# Patient Record
Sex: Female | Born: 1950 | Race: White | Hispanic: No | Smoking: Never smoker
Health system: Southern US, Community
[De-identification: ages and names within clinical notes are randomized; demographics above are authoritative.]

## PROBLEM LIST (undated history)

## (undated) DIAGNOSIS — M1990 Unspecified osteoarthritis, unspecified site: Secondary | ICD-10-CM

## (undated) DIAGNOSIS — H409 Unspecified glaucoma: Secondary | ICD-10-CM

## (undated) DIAGNOSIS — G8929 Other chronic pain: Secondary | ICD-10-CM

## (undated) DIAGNOSIS — E669 Obesity, unspecified: Secondary | ICD-10-CM

## (undated) DIAGNOSIS — E039 Hypothyroidism, unspecified: Secondary | ICD-10-CM

## (undated) DIAGNOSIS — M549 Dorsalgia, unspecified: Secondary | ICD-10-CM

## (undated) DIAGNOSIS — I10 Essential (primary) hypertension: Secondary | ICD-10-CM

## (undated) HISTORY — PX: ABDOMINAL HYSTERECTOMY: SHX81

## (undated) HISTORY — PX: TUBAL LIGATION: SHX77

## (undated) HISTORY — PX: REPLACEMENT TOTAL KNEE: SUR1224

## (undated) HISTORY — PX: LUMBAR LAMINECTOMY: SHX95

---

## 2004-12-18 ENCOUNTER — Ambulatory Visit: Payer: Self-pay | Admitting: Internal Medicine

## 2006-02-03 ENCOUNTER — Ambulatory Visit: Payer: Self-pay | Admitting: Internal Medicine

## 2010-12-25 ENCOUNTER — Ambulatory Visit: Payer: Self-pay | Admitting: Internal Medicine

## 2012-03-17 ENCOUNTER — Ambulatory Visit: Payer: Self-pay

## 2014-05-16 ENCOUNTER — Other Ambulatory Visit: Payer: Self-pay | Admitting: Internal Medicine

## 2014-05-16 DIAGNOSIS — Z1231 Encounter for screening mammogram for malignant neoplasm of breast: Secondary | ICD-10-CM

## 2014-05-30 ENCOUNTER — Ambulatory Visit
Admission: RE | Admit: 2014-05-30 | Discharge: 2014-05-30 | Disposition: A | Discharge status: Home / Self Care | Payer: BLUE CROSS/BLUE SHIELD | Source: Ambulatory Visit | Attending: Internal Medicine | Admitting: Internal Medicine

## 2014-05-30 DIAGNOSIS — Z1231 Encounter for screening mammogram for malignant neoplasm of breast: Secondary | ICD-10-CM | POA: Insufficient documentation

## 2014-09-06 ENCOUNTER — Emergency Department: Payer: No Typology Code available for payment source

## 2014-09-06 ENCOUNTER — Emergency Department
Admission: EM | Admit: 2014-09-06 | Discharge: 2014-09-06 | Disposition: A | Discharge status: Home / Self Care | Payer: No Typology Code available for payment source | Attending: Emergency Medicine | Admitting: Emergency Medicine

## 2014-09-06 ENCOUNTER — Encounter: Payer: Self-pay | Admitting: *Deleted

## 2014-09-06 DIAGNOSIS — S20211A Contusion of right front wall of thorax, initial encounter: Secondary | ICD-10-CM

## 2014-09-06 DIAGNOSIS — Y92410 Unspecified street and highway as the place of occurrence of the external cause: Secondary | ICD-10-CM | POA: Diagnosis not present

## 2014-09-06 DIAGNOSIS — Y998 Other external cause status: Secondary | ICD-10-CM | POA: Insufficient documentation

## 2014-09-06 DIAGNOSIS — Y9389 Activity, other specified: Secondary | ICD-10-CM | POA: Insufficient documentation

## 2014-09-06 DIAGNOSIS — I10 Essential (primary) hypertension: Secondary | ICD-10-CM | POA: Diagnosis not present

## 2014-09-06 DIAGNOSIS — S1081XA Abrasion of other specified part of neck, initial encounter: Secondary | ICD-10-CM | POA: Insufficient documentation

## 2014-09-06 DIAGNOSIS — S299XXA Unspecified injury of thorax, initial encounter: Secondary | ICD-10-CM | POA: Diagnosis present

## 2014-09-06 HISTORY — DX: Essential (primary) hypertension: I10

## 2014-09-06 LAB — GLUCOSE, CAPILLARY: GLUCOSE-CAPILLARY: 69 mg/dL (ref 65–99)

## 2014-09-06 MED ORDER — IBUPROFEN 800 MG PO TABS
800.0000 mg | ORAL_TABLET | Freq: Once | ORAL | Status: AC
  Administered 2014-09-06: 800 mg via ORAL
  Filled 2014-09-06: qty 1

## 2014-09-06 MED ORDER — NAPROXEN 500 MG PO TABS: 500.0000 mg | ORAL_TABLET | Freq: Two times a day (BID) | ORAL | Status: AC
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
## 2017-04-30 ENCOUNTER — Encounter: Payer: Self-pay | Admitting: *Deleted

## 2017-05-01 ENCOUNTER — Ambulatory Visit: Payer: Medicare Other | Admitting: Certified Registered"

## 2017-05-01 ENCOUNTER — Other Ambulatory Visit: Payer: Self-pay

## 2017-05-01 ENCOUNTER — Encounter
Admission: RE | Disposition: A | Discharge status: Home / Self Care | Payer: Self-pay | Source: Ambulatory Visit | Attending: Gastroenterology

## 2017-05-01 ENCOUNTER — Ambulatory Visit
Admission: RE | Admit: 2017-05-01 | Discharge: 2017-05-01 | Disposition: A | Discharge status: Home / Self Care | Payer: Medicare Other | Source: Ambulatory Visit | Attending: Gastroenterology | Admitting: Gastroenterology

## 2017-05-01 DIAGNOSIS — I10 Essential (primary) hypertension: Secondary | ICD-10-CM | POA: Insufficient documentation

## 2017-05-01 DIAGNOSIS — D122 Benign neoplasm of ascending colon: Secondary | ICD-10-CM | POA: Insufficient documentation

## 2017-05-01 DIAGNOSIS — K621 Rectal polyp: Secondary | ICD-10-CM | POA: Diagnosis not present

## 2017-05-01 DIAGNOSIS — Z1211 Encounter for screening for malignant neoplasm of colon: Secondary | ICD-10-CM | POA: Insufficient documentation

## 2017-05-01 DIAGNOSIS — E669 Obesity, unspecified: Secondary | ICD-10-CM | POA: Insufficient documentation

## 2017-05-01 DIAGNOSIS — Z79890 Hormone replacement therapy (postmenopausal): Secondary | ICD-10-CM | POA: Insufficient documentation

## 2017-05-01 DIAGNOSIS — E039 Hypothyroidism, unspecified: Secondary | ICD-10-CM | POA: Diagnosis not present

## 2017-05-01 DIAGNOSIS — K644 Residual hemorrhoidal skin tags: Secondary | ICD-10-CM | POA: Diagnosis not present

## 2017-05-01 DIAGNOSIS — Z79899 Other long term (current) drug therapy: Secondary | ICD-10-CM | POA: Diagnosis not present

## 2017-05-01 DIAGNOSIS — K5730 Diverticulosis of large intestine without perforation or abscess without bleeding: Secondary | ICD-10-CM | POA: Insufficient documentation

## 2017-05-01 DIAGNOSIS — Z791 Long term (current) use of non-steroidal anti-inflammatories (NSAID): Secondary | ICD-10-CM | POA: Insufficient documentation

## 2017-05-01 DIAGNOSIS — Z6841 Body Mass Index (BMI) 40.0 and over, adult: Secondary | ICD-10-CM | POA: Insufficient documentation

## 2017-05-01 HISTORY — PX: COLONOSCOPY WITH PROPOFOL: SHX5780

## 2017-05-01 HISTORY — DX: Hypomagnesemia: E83.42

## 2017-05-01 HISTORY — DX: Unspecified osteoarthritis, unspecified site: M19.90

## 2017-05-01 HISTORY — DX: Unspecified glaucoma: H40.9

## 2017-05-01 HISTORY — DX: Obesity, unspecified: E66.9

## 2017-05-01 HISTORY — DX: Dorsalgia, unspecified: M54.9

## 2017-05-01 HISTORY — DX: Other chronic pain: G89.29

## 2017-05-01 HISTORY — DX: Hypothyroidism, unspecified: E03.9

## 2017-05-01 SURGERY — COLONOSCOPY WITH PROPOFOL
Anesthesia: General

## 2017-05-01 MED ORDER — PROPOFOL 500 MG/50ML IV EMUL
INTRAVENOUS | Status: AC
  Filled 2017-05-01: qty 50

## 2017-05-01 MED ORDER — PROPOFOL 10 MG/ML IV BOLUS
INTRAVENOUS | Status: DC | PRN
  Administered 2017-05-01 (×2): 50 mg via INTRAVENOUS
  Administered 2017-05-01: 100 mg via INTRAVENOUS

## 2017-05-01 MED ORDER — LIDOCAINE HCL (PF) 2 % IJ SOLN
INTRAMUSCULAR | Status: AC
  Filled 2017-05-01: qty 10

## 2017-05-01 MED ORDER — SODIUM CHLORIDE 0.9 % IV SOLN
INTRAVENOUS | Status: DC
  Administered 2017-05-01: 10:00:00 via INTRAVENOUS

## 2017-05-01 MED ORDER — PROPOFOL 500 MG/50ML IV EMUL
INTRAVENOUS | Status: DC | PRN
  Administered 2017-05-01: 100 ug/kg/min via INTRAVENOUS

## 2017-05-01 MED ORDER — PROPOFOL 10 MG/ML IV BOLUS
INTRAVENOUS | Status: AC
  Filled 2017-05-01: qty 20

## 2017-05-01 MED ORDER — LIDOCAINE HCL (CARDIAC) PF 100 MG/5ML IV SOSY
PREFILLED_SYRINGE | INTRAVENOUS | Status: DC | PRN
  Administered 2017-05-01: 40 mg via INTRATRACHEAL

## 2017-05-01 NOTE — H&P (Signed)
Outpatient short stay form Pre-procedure 05/01/2017 10:07 AM Lollie Sails MD  Primary Physician: Dr. Fulton Reek  Reason for visit: Colonoscopy  History of present illness: Patient is a 67 year old female presenting today as above.  Takes no aspirin or blood thinning agent.  This is her first colonoscopy.  She tolerated her prep well.   No current facility-administered medications for this encounter.   Medications Prior to Admission  Medication Sig Dispense Refill Last Dose  . Cholecalciferol 5000 units TABS Take 1 tablet by mouth daily.     . cloNIDine (CATAPRES) 0.2 MG tablet Take 0.2 mg by mouth 2 (two) times daily.     Marland Kitchen estradiol (ESTRACE) 0.5 MG tablet Take 0.5 mg by mouth daily.     Marland Kitchen ibuprofen (ADVIL,MOTRIN) 200 MG tablet Take 200 mg by mouth every 6 (six) hours as needed.     Marland Kitchen levothyroxine (SYNTHROID, LEVOTHROID) 125 MCG tablet Take 125 mcg by mouth daily before breakfast.     . magnesium oxide (MAG-OX) 400 MG tablet Take 400 mg by mouth daily.     . meloxicam (MOBIC) 15 MG tablet Take 15 mg by mouth daily.     Marland Kitchen torsemide (DEMADEX) 20 MG tablet Take 20 mg by mouth daily.     . naproxen (NAPROSYN) 500 MG tablet Take 1 tablet (500 mg total) by mouth 2 (two) times daily with a meal. 20 tablet 0      Allergies  Allergen Reactions  . Losartan Potassium-Hctz      Past Medical History:  Diagnosis Date  . Arthritis    osteoarthritis  . Chronic back pain   . Glaucoma   . Hypertension   . Hypomagnesemia   . Hypothyroidism   . Obesity     Review of systems:      Physical Exam    Heart and lungs: Regular rate and rhythm without rub or gallop, lungs are bilaterally clear.    HEENT: Normocephalic atraumatic eyes are anicteric    Other:    Pertinant exam for procedure: Soft nontender nondistended bowel sounds positive normoactive    Planned proceedures: Colonoscopy and indicated procedures. I have discussed the risks benefits and complications of  procedures to include not limited to bleeding, infection, perforation and the risk of sedation and the patient wishes to proceed.    Lollie Sails, MD Gastroenterology 05/01/2017  10:07 AM

## 2017-05-01 NOTE — Transfer of Care (Signed)
Immediate Anesthesia Transfer of Care Note  Patient: Jade Martin  Procedure(s) Performed: COLONOSCOPY WITH PROPOFOL (N/A )  Patient Location: PACU and Endoscopy Unit  Anesthesia Type:General  Level of Consciousness: awake, alert , oriented and patient cooperative  Airway & Oxygen Therapy: Patient Spontanous Breathing  Post-op Assessment: Report given to RN, Post -op Vital signs reviewed and stable and Patient moving all extremities  Post vital signs: Reviewed and stable  Last Vitals:  Vitals Value Taken Time  BP 114/50 05/01/2017 11:12 AM  Temp 36.3 C 05/01/2017 11:12 AM  Pulse 79 05/01/2017 11:13 AM  Resp 16 05/01/2017 11:13 AM  SpO2 97 % 05/01/2017 11:13 AM  Vitals shown include unvalidated device data.  Last Pain:  Vitals:   05/01/17 1112  TempSrc: Tympanic  PainSc: 0-No pain         Complications: No apparent anesthesia complications

## 2017-05-01 NOTE — Anesthesia Preprocedure Evaluation (Signed)
Anesthesia Evaluation  Patient identified by MRN, date of birth, ID band Patient awake    Reviewed: Allergy & Precautions, H&P , NPO status , Patient's Chart, lab work & pertinent test results, reviewed documented beta blocker date and time   Airway Mallampati: II   Neck ROM: full    Dental  (+) Poor Dentition, Teeth Intact   Pulmonary neg pulmonary ROS,    Pulmonary exam normal        Cardiovascular Exercise Tolerance: Good hypertension, On Medications negative cardio ROS Normal cardiovascular exam Rhythm:regular Rate:Normal     Neuro/Psych negative neurological ROS  negative psych ROS   GI/Hepatic negative GI ROS, Neg liver ROS,   Endo/Other  negative endocrine ROSHypothyroidism   Renal/GU negative Renal ROS  negative genitourinary   Musculoskeletal   Abdominal   Peds  Hematology negative hematology ROS (+)   Anesthesia Other Findings Past Medical History: No date: Arthritis     Comment:  osteoarthritis No date: Chronic back pain No date: Glaucoma No date: Hypertension No date: Hypomagnesemia No date: Hypothyroidism No date: Obesity Past Surgical History: No date: ABDOMINAL HYSTERECTOMY No date: LUMBAR LAMINECTOMY No date: TUBAL LIGATION   Reproductive/Obstetrics negative OB ROS                             Anesthesia Physical Anesthesia Plan  ASA: III  Anesthesia Plan: General   Post-op Pain Management:    Induction:   PONV Risk Score and Plan:   Airway Management Planned:   Additional Equipment:   Intra-op Plan:   Post-operative Plan:   Informed Consent: I have reviewed the patients History and Physical, chart, labs and discussed the procedure including the risks, benefits and alternatives for the proposed anesthesia with the patient or authorized representative who has indicated his/her understanding and acceptance.   Dental Advisory Given  Plan Discussed  with: CRNA  Anesthesia Plan Comments:         Anesthesia Quick Evaluation

## 2017-05-01 NOTE — Op Note (Signed)
[facility] Patient Name: Jade Martin Procedure Date: 05/01/2017 10:22 AM MRN: [id number] Account #: [id number] Date of Birth: 1950/09/22 Admit Type: Outpatient Age: 67 Room: [facility] ENDO ROOM 1 Gender: Female Note Status: Finalized Procedure:            Colonoscopy Indications:          Screening for colorectal malignant neoplasm, This is                        the patient's first colonoscopy Providers:            Lollie Sails, MD Referring MD:         Leonie Douglas. Doy Hutching, MD (Referring MD) Medicines:            Monitored Anesthesia Care Complications:        No immediate complications. Procedure:            Pre-Anesthesia Assessment:                       - ASA Grade Assessment: III - A patient with severe                        systemic disease.                       After obtaining informed consent, the colonoscope was                        passed under direct vision. Throughout the procedure,                        the patient's blood pressure, pulse, and oxygen                        saturations were monitored continuously. The                        Colonoscope was introduced through the anus and                        advanced to the the cecum, identified by appendiceal                        orifice and ileocecal valve. The colonoscopy was                        performed without difficulty. The patient tolerated the                        procedure well. The quality of the bowel preparation                        was good. The Colonoscope was introduced through the                        anus and advanced to the the cecum, identified by                        appendiceal orifice and ileocecal valve. The quality of  the bowel preparation was good. Findings:      A few small-mouthed diverticula were found in the sigmoid colon.      Three sessile polyps were found in the rectum. The polyps were 2 to 3 mm   in size. These polyps were removed with a cold snare and cold forcep.       Resection and retrieval were complete.      A 3 mm polyp was found in the ascending colon. The polyp was sessile.       The polyp was removed with a cold biopsy forceps. Resection and       retrieval were complete.      The exam was otherwise normal throughout the examined colon.      The retroflexed view of the distal rectum and anal verge was normal and       showed no anal or rectal abnormalities.      Skin tag, about 3-4 cm on a single stalk, found on perianal exam, this       about 5 cm from the anal orifice on the right gluteul area, without       apparent inflammation.      -The scope was changed due to scope coming out with flatus passing. Impression:           - Diverticulosis in the sigmoid colon.                       - Three 2 to 3 mm polyps in the rectum, removed with a                        cold snare. Resected and retrieved.                       - One 3 mm polyp in the ascending colon, removed with a                        cold biopsy forceps. Resected and retrieved.                       - The distal rectum and anal verge are normal on                        retroflexion view.                       - Perianal skin tags found on perianal exam. Recommendation:       - Discharge patient to home.                       - Await pathology results.                       - Telephone GI clinic for pathology results in 1 week.                       - consider referral to dermatology RE: skin tag as noted Procedure Code(s):    --- Professional ---                       [id number], Colonoscopy, flexible; with removal of tumor(s),  polyp(s), or other lesion(s) by snare technique                       45380, 59, Colonoscopy, flexible; with biopsy, single                        or multiple Diagnosis Code(s):    --- Professional ---                       Z12.11, Encounter for screening for  malignant neoplasm                        of colon                       K62.1, Rectal polyp                       D12.2, Benign neoplasm of ascending colon                       K64.4, Residual hemorrhoidal skin tags                       K57.30, Diverticulosis of large intestine without                        perforation or abscess without bleeding CPT copyright 2017 American Medical Association. All rights reserved. The codes documented in this report are preliminary and upon coder review may  be revised to meet current compliance requirements. Lollie Sails, MD 05/01/2017 11:17:45 AM This report has been signed electronically. Number of Addenda: 0 Note Initiated On: 05/01/2017 10:22 AM Scope Withdrawal Time: 0 hours 9 minutes 18 seconds  Total Procedure Duration: 0 hours 25 minutes 33 seconds       [facility]

## 2017-05-01 NOTE — Anesthesia Postprocedure Evaluation (Signed)
Anesthesia Post Note  Patient: Jade Martin  Procedure(s) Performed: COLONOSCOPY WITH PROPOFOL (N/A )  Patient location during evaluation: Endoscopy Anesthesia Type: General Level of consciousness: awake and alert, oriented and patient cooperative Pain management: satisfactory to patient Vital Signs Assessment: post-procedure vital signs reviewed and stable Respiratory status: spontaneous breathing and respiratory function stable Cardiovascular status: blood pressure returned to baseline and stable Postop Assessment: no headache, no backache, patient able to bend at knees, no apparent nausea or vomiting and adequate PO intake Anesthetic complications: no     Last Vitals:  Vitals:   05/01/17 1015 05/01/17 1112  BP: (!) 187/96 (!) 114/50  Pulse: 83 76  Resp:  14  Temp: 36.7 C (!) 36.3 C  SpO2: 99% 97%    Last Pain:  Vitals:   05/01/17 1112  TempSrc: Tympanic  PainSc: 0-No pain                 Lorenza Winkleman H Avery Eustice

## 2017-05-01 NOTE — Anesthesia Post-op Follow-up Note (Signed)
Anesthesia QCDR form completed.        

## 2017-05-04 ENCOUNTER — Encounter: Payer: Self-pay | Admitting: Gastroenterology

## 2017-05-04 LAB — SURGICAL PATHOLOGY

## 2017-09-14 ENCOUNTER — Other Ambulatory Visit: Payer: Self-pay | Admitting: Internal Medicine

## 2017-09-14 DIAGNOSIS — Z1231 Encounter for screening mammogram for malignant neoplasm of breast: Secondary | ICD-10-CM

## 2017-09-23 ENCOUNTER — Ambulatory Visit
Admission: RE | Admit: 2017-09-23 | Discharge: 2017-09-23 | Disposition: A | Discharge status: Home / Self Care | Payer: Medicare Other | Source: Ambulatory Visit | Attending: Internal Medicine | Admitting: Internal Medicine

## 2017-09-23 DIAGNOSIS — Z1231 Encounter for screening mammogram for malignant neoplasm of breast: Secondary | ICD-10-CM | POA: Insufficient documentation

## 2019-04-18 ENCOUNTER — Other Ambulatory Visit: Payer: Self-pay | Admitting: Internal Medicine

## 2019-04-18 DIAGNOSIS — Z1231 Encounter for screening mammogram for malignant neoplasm of breast: Secondary | ICD-10-CM

## 2019-05-02 ENCOUNTER — Ambulatory Visit
Admission: RE | Admit: 2019-05-02 | Discharge: 2019-05-02 | Disposition: A | Discharge status: Home / Self Care | Payer: Medicare Other | Source: Ambulatory Visit | Attending: Internal Medicine | Admitting: Internal Medicine

## 2019-05-02 DIAGNOSIS — Z1231 Encounter for screening mammogram for malignant neoplasm of breast: Secondary | ICD-10-CM

## 2019-05-04 ENCOUNTER — Other Ambulatory Visit: Payer: Self-pay | Admitting: Internal Medicine

## 2019-05-04 DIAGNOSIS — N6489 Other specified disorders of breast: Secondary | ICD-10-CM

## 2019-05-04 DIAGNOSIS — R928 Other abnormal and inconclusive findings on diagnostic imaging of breast: Secondary | ICD-10-CM

## 2019-05-18 ENCOUNTER — Ambulatory Visit
Admission: RE | Admit: 2019-05-18 | Discharge: 2019-05-18 | Disposition: A | Discharge status: Home / Self Care | Payer: Medicare Other | Source: Ambulatory Visit | Attending: Internal Medicine | Admitting: Internal Medicine

## 2019-05-18 DIAGNOSIS — R928 Other abnormal and inconclusive findings on diagnostic imaging of breast: Secondary | ICD-10-CM

## 2019-05-18 DIAGNOSIS — N6489 Other specified disorders of breast: Secondary | ICD-10-CM | POA: Insufficient documentation

## 2019-05-19 ENCOUNTER — Other Ambulatory Visit: Payer: Self-pay | Admitting: Internal Medicine

## 2019-05-19 DIAGNOSIS — N6320 Unspecified lump in the left breast, unspecified quadrant: Secondary | ICD-10-CM

## 2019-05-19 DIAGNOSIS — R921 Mammographic calcification found on diagnostic imaging of breast: Secondary | ICD-10-CM

## 2019-05-19 DIAGNOSIS — R928 Other abnormal and inconclusive findings on diagnostic imaging of breast: Secondary | ICD-10-CM

## 2020-01-05 ENCOUNTER — Other Ambulatory Visit: Payer: Self-pay

## 2020-01-05 ENCOUNTER — Ambulatory Visit
Admission: RE | Admit: 2020-01-05 | Discharge: 2020-01-05 | Disposition: A | Discharge status: Home / Self Care | Payer: Medicare Other | Source: Ambulatory Visit | Attending: Internal Medicine | Admitting: Internal Medicine

## 2020-01-05 DIAGNOSIS — R921 Mammographic calcification found on diagnostic imaging of breast: Secondary | ICD-10-CM | POA: Diagnosis present

## 2020-01-05 DIAGNOSIS — R928 Other abnormal and inconclusive findings on diagnostic imaging of breast: Secondary | ICD-10-CM | POA: Insufficient documentation

## 2020-01-05 DIAGNOSIS — N6320 Unspecified lump in the left breast, unspecified quadrant: Secondary | ICD-10-CM

## 2020-01-15 ENCOUNTER — Other Ambulatory Visit: Payer: Self-pay

## 2020-01-15 ENCOUNTER — Encounter: Payer: Self-pay | Admitting: Emergency Medicine

## 2020-01-15 ENCOUNTER — Ambulatory Visit
Admission: EM | Admit: 2020-01-15 | Discharge: 2020-01-15 | Disposition: A | Discharge status: Home / Self Care | Payer: Medicare Other | Attending: Physician Assistant | Admitting: Physician Assistant

## 2020-01-15 DIAGNOSIS — M5442 Lumbago with sciatica, left side: Secondary | ICD-10-CM

## 2020-01-15 MED ORDER — MELOXICAM 15 MG PO TABS: 15.0000 mg | ORAL_TABLET | Freq: Every day | ORAL | 0 refills | Status: AC

## 2020-01-15 MED ORDER — HYDROCODONE-ACETAMINOPHEN 5-325 MG PO TABS: 1.0000 | ORAL_TABLET | Freq: Four times a day (QID) | ORAL | 0 refills | Status: AC | PRN

## 2020-01-15 NOTE — Discharge Instructions (Addendum)
BACK PAIN: Stressed avoiding painful activities . RICE (REST, ICE, COMPRESSION, ELEVATION) guidelines reviewed. May alternate ice and heat. Consider use of muscle rubs, Salonpas patches, etc. Use medications as directed including muscle relaxers if prescribed. Take anti-inflammatory medications as prescribed or OTC NSAIDs/Tylenol.  F/u with PCP in 7-10 days for reexamination, and please feel free to call or return to the urgent care at any time for any questions or concerns you may have and we will be happy to help you!  ° °BACK PAIN RED FLAGS: If the back pain acutely worsens or there are any red flag symptoms such as numbness/tingling, leg weakness, saddle anesthesia, or loss of bowel/bladder control, go immediately to the ER. Follow up with us as scheduled or sooner if the pain does not begin to resolve or if it worsens before the follow up   ° °You may have a condition requiring you to follow up with Orthopedics so please call one of the following office for appointment:  ° °[facility] °[address], [place], [place] [address] °Phone: [phone] ° °[facility] °[address], [place], [place] [address] °Phone: [phone]  °

## 2020-01-15 NOTE — ED Triage Notes (Signed)
Patient c/o left hip and lower back pain that goes down her left leg that started 2 weeks.  Patient denies injury or fall.

## 2020-01-15 NOTE — ED Provider Notes (Signed)
[facility]    CSN: [id number] Arrival date & time: 01/15/20  1015      History   Chief Complaint Chief Complaint  Patient presents with   Hip Pain   Back Pain    HPI Jade Martin is a 70 y.o. female presenting for approximately 2-week history of left lower back pain with radiation to the lateral left thigh/hip.  Patient states that she also has a little bit of numbness to the area of the thigh.  Denies any weakness or falls.  Denies any specific injury.  Patient states that she believes she has sciatica because she has had it in the past.  Patient states she has had lumbar laminectomy in 1992 and has not really had any problems with her back since.  She has been taking high doses of ibuprofen over-the-counter as well as baclofen without much improvement in symptoms.  Patient admits that she has to take care of her disabled daughter which is very stressful.  She says she has not really been able to sleep very well at night because the pain.  Patient denies any loss of bowel or bladder control or saddle anesthesia.  No other complaints or concerns.  HPI  Past Medical History:  Diagnosis Date   Arthritis    osteoarthritis   Chronic back pain    Glaucoma    Hypertension    Hypomagnesemia    Hypothyroidism    Obesity     There are no problems to display for this patient.   Past Surgical History:  Procedure Laterality Date   ABDOMINAL HYSTERECTOMY     COLONOSCOPY WITH PROPOFOL N/A 05/01/2017   Procedure: COLONOSCOPY WITH PROPOFOL;  Surgeon: Lollie Sails, MD;  Location: [facility] ENDOSCOPY;  Service: Endoscopy;  Laterality: N/A;   LUMBAR LAMINECTOMY     REPLACEMENT TOTAL KNEE Bilateral    TUBAL LIGATION      OB History   No obstetric history on file.      Home Medications    Prior to Admission medications   Medication Sig Start Date End Date Taking? Authorizing Provider  Cholecalciferol 5000 units TABS Take 1 tablet by mouth daily.   Yes  [provider]  cloNIDine (CATAPRES) 0.2 MG tablet Take 0.2 mg by mouth 2 (two) times daily.   Yes [provider]  estradiol (ESTRACE) 0.5 MG tablet Take 0.5 mg by mouth daily.   Yes [provider]  HYDROcodone-acetaminophen (NORCO/VICODIN) 5-325 MG tablet Take 1 tablet by mouth every 6 (six) hours as needed for up to 3 days. 01/15/20 01/18/20 Yes Danton Clap, PA-C  ibuprofen (ADVIL,MOTRIN) 200 MG tablet Take 200 mg by mouth every 6 (six) hours as needed.   Yes [provider]  levothyroxine (SYNTHROID, LEVOTHROID) 125 MCG tablet Take 125 mcg by mouth daily before breakfast.   Yes [provider]  magnesium oxide (MAG-OX) 400 MG tablet Take 400 mg by mouth daily.   Yes [provider]  naproxen (NAPROSYN) 500 MG tablet Take 1 tablet (500 mg total) by mouth 2 (two) times daily with a meal. 09/06/14  Yes Carrie Mew, MD  torsemide (DEMADEX) 20 MG tablet Take 20 mg by mouth daily.   Yes [provider]  meloxicam (MOBIC) 15 MG tablet Take 1 tablet (15 mg total) by mouth daily. 01/15/20 02/14/20  Danton Clap, PA-C    Family History Family History  Problem Relation Age of Onset   Breast cancer Neg Hx  Social History Social History   Tobacco Use   Smoking status: Never Smoker   Smokeless tobacco: Never Used  Scientific laboratory technician Use: Never used  Substance Use Topics   Alcohol use: Yes    Comment: 1 drink every 2-3 month   Drug use: No     Allergies   Losartan potassium-hctz   Review of Systems Review of Systems  Constitutional: Negative for fatigue and fever.  Gastrointestinal: Negative for abdominal pain and vomiting.  Genitourinary: Negative for dysuria.  Musculoskeletal: Positive for back pain. Negative for arthralgias.  Skin: Negative for rash.  Neurological: Negative for weakness and numbness.     Physical Exam Triage Vital Signs ED Triage Vitals  Enc Vitals Group     BP 01/15/20 1032  (!) 179/81     Pulse Rate 01/15/20 1032 66     Resp 01/15/20 1032 16     Temp 01/15/20 1032 98.3 F (36.8 C)     Temp Source 01/15/20 1032 Oral     SpO2 01/15/20 1032 98 %     Weight 01/15/20 1029 235 lb (106.6 kg)     Height 01/15/20 1029 5\' 4"  (1.626 m)     Head Circumference --      Peak Flow --      Pain Score 01/15/20 1029 6     Pain Loc --      Pain Edu? --      Excl. in [place]? --    No data found.  Updated Vital Signs BP (!) 179/81 (BP Location: Left Arm) Comment: Patient has not been taking her Clonidine   Pulse 66    Temp 98.3 F (36.8 C) (Oral)    Resp 16    Ht 5\' 4"  (1.626 m)    Wt 235 lb (106.6 kg)    SpO2 98%    BMI 40.34 kg/m       Physical Exam Vitals and nursing note reviewed.  Constitutional:      General: She is not in acute distress.    Appearance: Normal appearance. She is not ill-appearing or toxic-appearing.  HENT:     Head: Normocephalic and atraumatic.     Nose: Nose normal.     Mouth/Throat:     Mouth: Mucous membranes are moist.     Pharynx: Oropharynx is clear.  Eyes:     General: No scleral icterus.       Right eye: No discharge.        Left eye: No discharge.     Conjunctiva/sclera: Conjunctivae normal.  Cardiovascular:     Rate and Rhythm: Normal rate and regular rhythm.     Heart sounds: Normal heart sounds.  Pulmonary:     Effort: Pulmonary effort is normal. No respiratory distress.     Breath sounds: Normal breath sounds.  Musculoskeletal:     Cervical back: Neck supple.  Skin:    General: Skin is dry.  Neurological:     General: No focal deficit present.     Mental Status: She is alert. Mental status is at baseline.     Motor: No weakness.     Gait: Gait normal.  Psychiatric:        Mood and Affect: Mood normal.        Behavior: Behavior normal.        Thought Content: Thought content normal.      UC Treatments / Results  Labs (all labs ordered are listed, but only abnormal results are  displayed) Labs Reviewed - No data  to display  EKG   Radiology No results found.  Procedures Procedures (including critical care time)  Medications Ordered in UC Medications - No data to display  Initial Impression / Assessment and Plan / UC Course  I have reviewed the triage vital signs and the nursing notes.  Pertinent labs & imaging results that were available during my care of the patient were reviewed by me and considered in my medical decision making (see chart for details).    70 year old female presenting for 2-week history of atraumatic back pain.  Exam is consistent with lumbar strain versus bulging disc or sciatica.  Treating with meloxicam.  Patient states she has muscle relaxers at home.  Also sent a short supply of Norco.  Controlled substance database reviewed and patient low risk for abuse.  Return and ED precautions for back pain discussed with patient.   Final Clinical Impressions(s) / UC Diagnoses   Final diagnoses:  Acute left-sided low back pain with left-sided sciatica     Discharge Instructions     BACK PAIN: Stressed avoiding painful activities . RICE (REST, ICE, COMPRESSION, ELEVATION) guidelines reviewed. May alternate ice and heat. Consider use of muscle rubs, Salonpas patches, etc. Use medications as directed including muscle relaxers if prescribed. Take anti-inflammatory medications as prescribed or OTC NSAIDs/Tylenol.  F/u with PCP in 7-10 days for reexamination, and please feel free to call or return to the urgent care at any time for any questions or concerns you may have and we will be happy to help you!   BACK PAIN RED FLAGS: If the back pain acutely worsens or there are any red flag symptoms such as numbness/tingling, leg weakness, saddle anesthesia, or loss of bowel/bladder control, go immediately to the ER. Follow up with [place] as scheduled or sooner if the pain does not begin to resolve or if it worsens before the follow up    You may have a condition requiring you to follow up  with Orthopedics so please call one of the following office for appointment:   [facility] [address] [place], [place] [address] Phone: [phone]  [facility] [address] [place], [place] [address] Phone: [phone]     ED Prescriptions    Medication Sig Dispense Auth. Provider   meloxicam (MOBIC) 15 MG tablet Take 1 tablet (15 mg total) by mouth daily. 30 tablet Danton Clap, PA-C   HYDROcodone-acetaminophen (NORCO/VICODIN) 5-325 MG tablet Take 1 tablet by mouth every 6 (six) hours as needed for up to 3 days. 10 tablet Danton Clap, PA-C     I have reviewed the PDMP during this encounter.   Danton Clap, PA-C 01/17/20 1013

## 2020-08-14 ENCOUNTER — Other Ambulatory Visit: Payer: Self-pay | Admitting: Internal Medicine

## 2020-08-14 DIAGNOSIS — N6320 Unspecified lump in the left breast, unspecified quadrant: Secondary | ICD-10-CM

## 2020-08-22 ENCOUNTER — Other Ambulatory Visit: Payer: Self-pay

## 2020-08-22 ENCOUNTER — Ambulatory Visit
Admission: RE | Admit: 2020-08-22 | Discharge: 2020-08-22 | Disposition: A | Discharge status: Home / Self Care | Payer: Medicare Other | Source: Ambulatory Visit | Attending: Internal Medicine | Admitting: Internal Medicine

## 2020-08-22 DIAGNOSIS — N6320 Unspecified lump in the left breast, unspecified quadrant: Secondary | ICD-10-CM | POA: Diagnosis present

## 2020-08-22 DIAGNOSIS — N6002 Solitary cyst of left breast: Secondary | ICD-10-CM | POA: Diagnosis not present

## 2021-09-03 ENCOUNTER — Other Ambulatory Visit: Payer: Self-pay | Admitting: Internal Medicine

## 2021-09-03 DIAGNOSIS — Z1231 Encounter for screening mammogram for malignant neoplasm of breast: Secondary | ICD-10-CM

## 2021-09-06 IMAGING — MG DIGITAL SCREENING BILAT W/ TOMO W/ CAD
6 of 12 series · 6 of 36 positions shown · non-contrast
Comparison: Previous exam(s).

CLINICAL DATA: Screening.

EXAM:
DIGITAL SCREENING BILATERAL MAMMOGRAM WITH TOMO AND CAD

[L CC synth-2D]
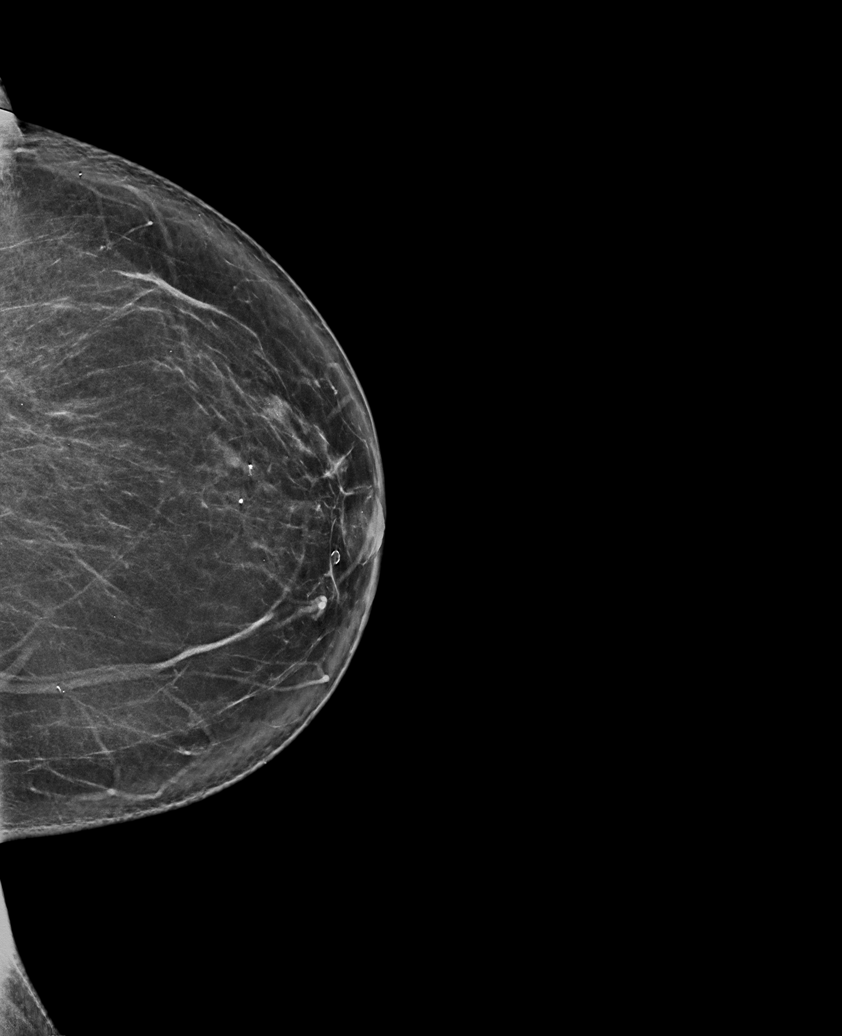

[R MLO synth-2D (1 of 2)]
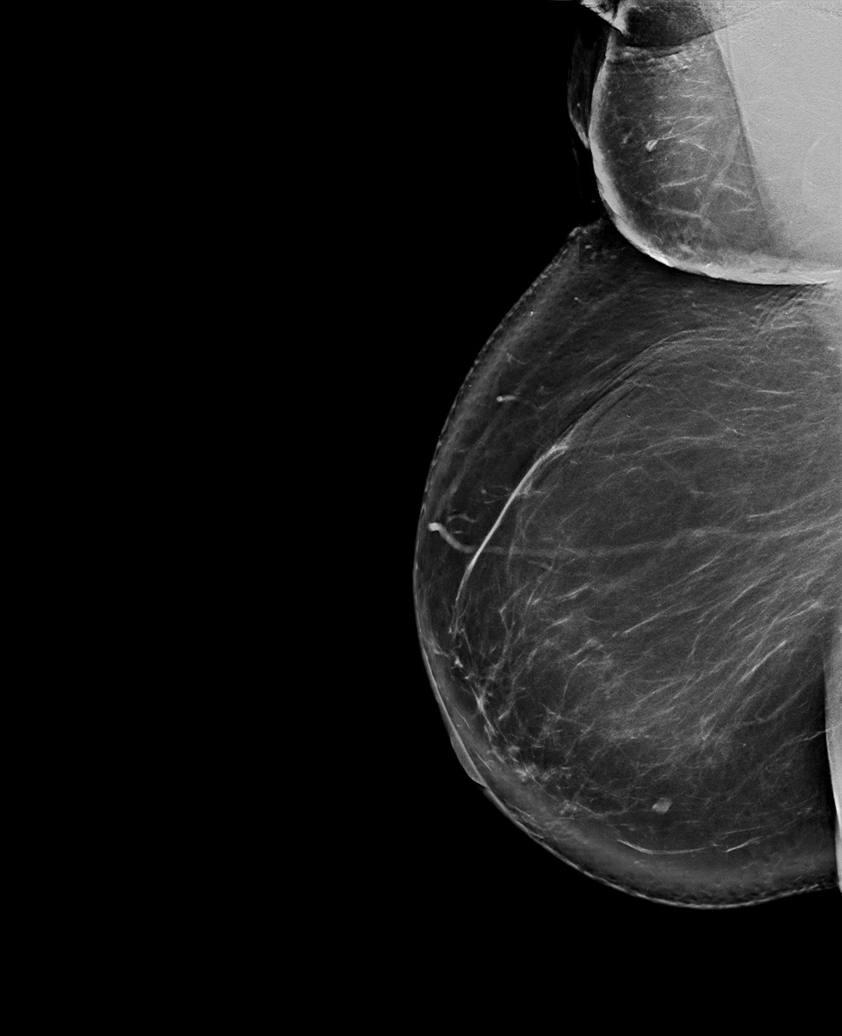

[L MLO synth-2D (1 of 2)]
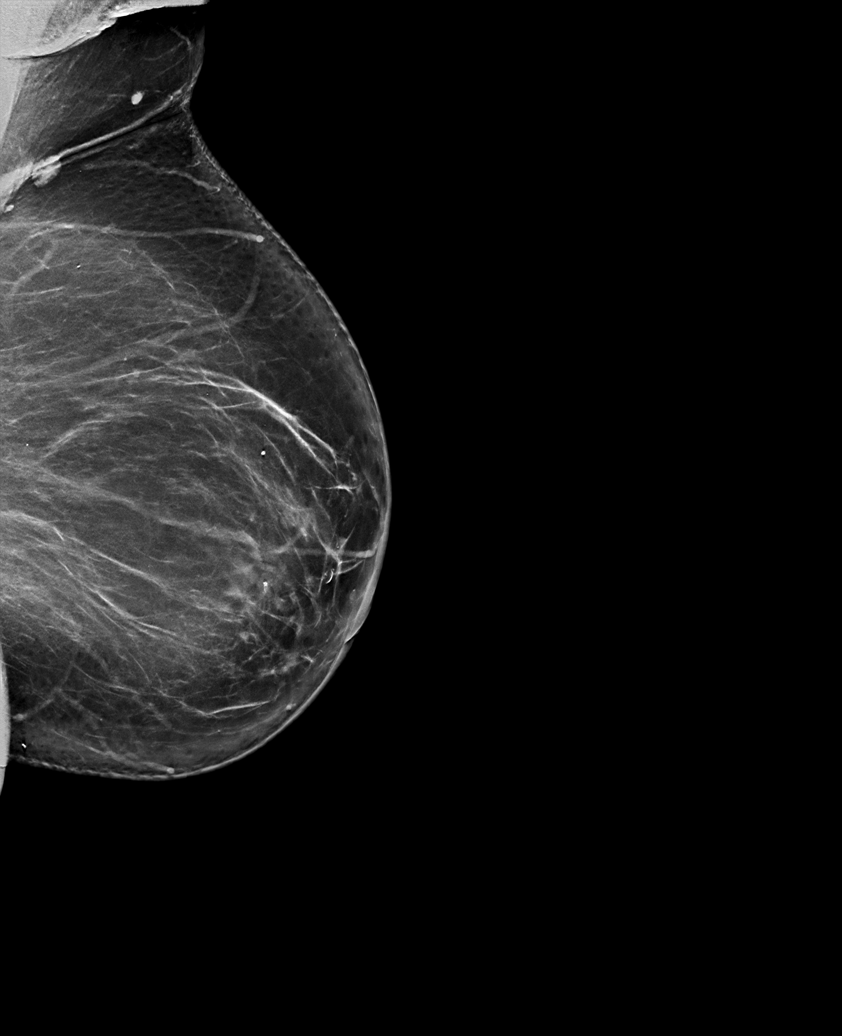

[L MLO synth-2D (2 of 2)]
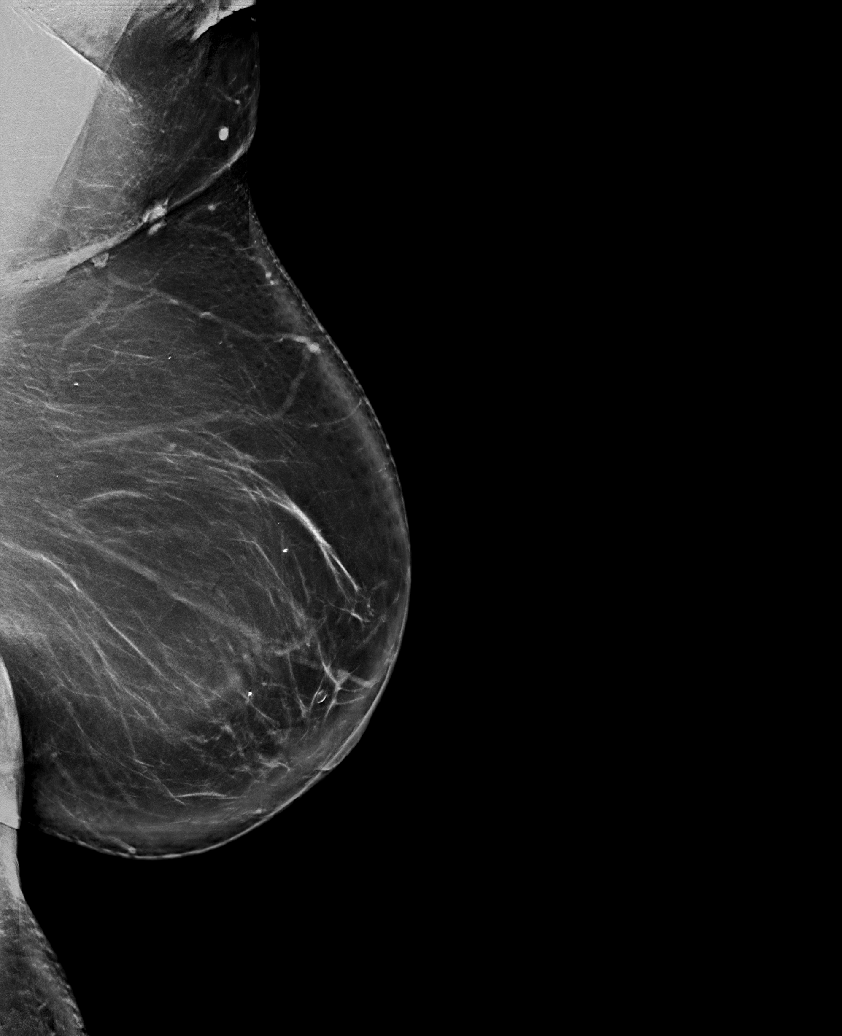

[R CC synth-2D]
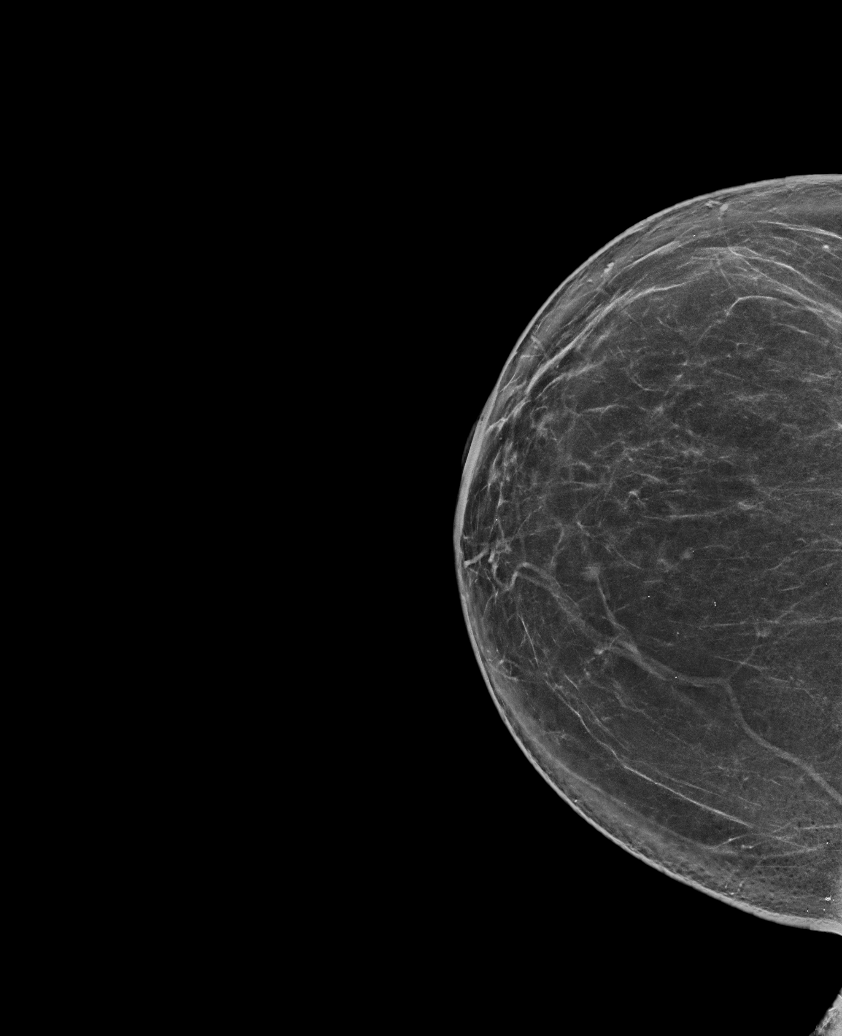

[R MLO synth-2D (2 of 2)]
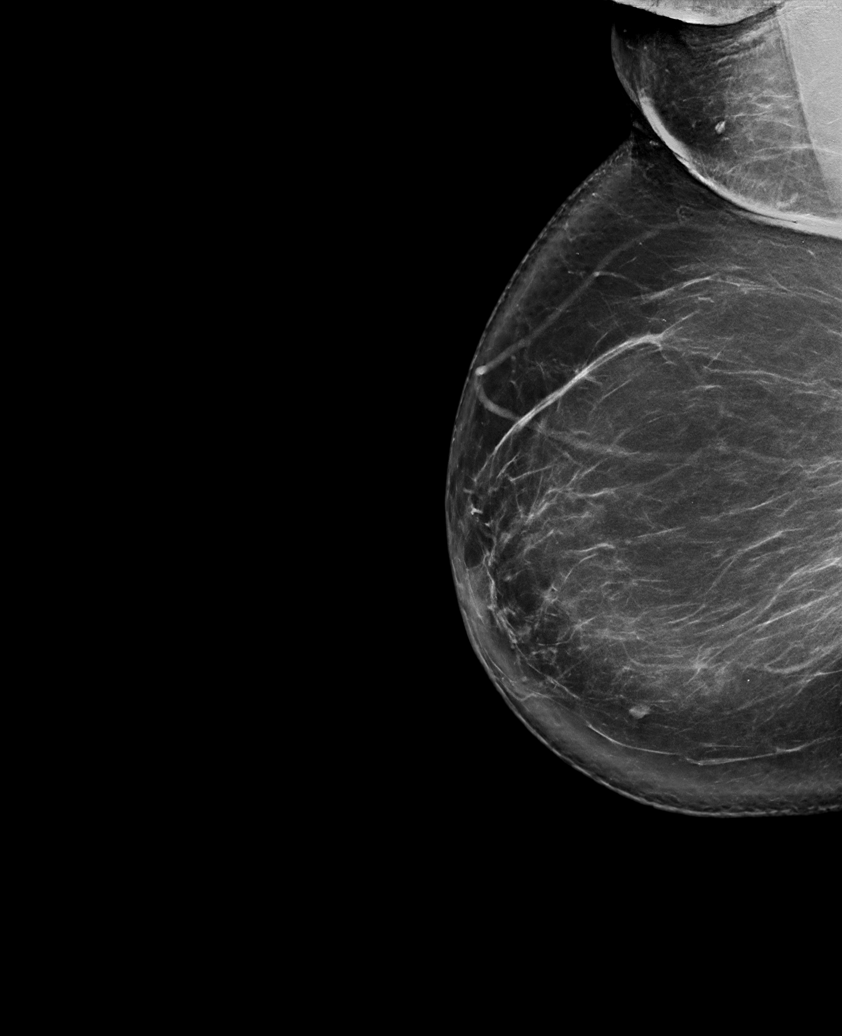

[6 of 36 positions shown; findings below may reference images not displayed]

ACR Breast Density Category b: There are scattered areas of
fibroglandular density.
FINDINGS: In the left breast, a possible asymmetry warrants further
evaluation. In the right breast, no findings suspicious for
malignancy. Images were processed with CAD.
IMPRESSION: Further evaluation is suggested for possible asymmetry in the left
breast.

RECOMMENDATION:
Diagnostic mammogram and possibly ultrasound of the left breast.
(Code:DI-5-LL4)

The patient will be contacted regarding the findings, and additional
imaging will be scheduled.

BI-RADS CATEGORY  0: Incomplete. Need additional imaging evaluation
and/or prior mammograms for comparison.

## 2022-03-11 ENCOUNTER — Other Ambulatory Visit: Payer: Self-pay | Admitting: Internal Medicine

## 2022-03-11 DIAGNOSIS — Z1231 Encounter for screening mammogram for malignant neoplasm of breast: Secondary | ICD-10-CM

## 2023-03-19 ENCOUNTER — Encounter: Payer: Self-pay | Admitting: *Deleted

## 2023-03-27 ENCOUNTER — Encounter: Payer: Self-pay | Admitting: *Deleted

## 2023-03-27 ENCOUNTER — Ambulatory Visit: Admitting: Anesthesiology

## 2023-03-27 ENCOUNTER — Encounter
Admission: RE | Disposition: A | Discharge status: Home / Self Care | Payer: Self-pay | Source: Home / Self Care | Attending: Gastroenterology

## 2023-03-27 ENCOUNTER — Ambulatory Visit
Admission: RE | Admit: 2023-03-27 | Discharge: 2023-03-27 | Disposition: A | Discharge status: Home / Self Care | Payer: Medicare Other | Attending: Gastroenterology | Admitting: Gastroenterology

## 2023-03-27 DIAGNOSIS — E66813 Obesity, class 3: Secondary | ICD-10-CM | POA: Insufficient documentation

## 2023-03-27 DIAGNOSIS — K648 Other hemorrhoids: Secondary | ICD-10-CM | POA: Diagnosis not present

## 2023-03-27 DIAGNOSIS — E039 Hypothyroidism, unspecified: Secondary | ICD-10-CM | POA: Diagnosis not present

## 2023-03-27 DIAGNOSIS — Z1211 Encounter for screening for malignant neoplasm of colon: Secondary | ICD-10-CM | POA: Diagnosis present

## 2023-03-27 DIAGNOSIS — Z79890 Hormone replacement therapy (postmenopausal): Secondary | ICD-10-CM | POA: Insufficient documentation

## 2023-03-27 DIAGNOSIS — Z90710 Acquired absence of both cervix and uterus: Secondary | ICD-10-CM | POA: Diagnosis not present

## 2023-03-27 DIAGNOSIS — K640 First degree hemorrhoids: Secondary | ICD-10-CM | POA: Diagnosis not present

## 2023-03-27 DIAGNOSIS — I10 Essential (primary) hypertension: Secondary | ICD-10-CM | POA: Insufficient documentation

## 2023-03-27 DIAGNOSIS — Z6839 Body mass index (BMI) 39.0-39.9, adult: Secondary | ICD-10-CM | POA: Diagnosis not present

## 2023-03-27 DIAGNOSIS — K644 Residual hemorrhoidal skin tags: Secondary | ICD-10-CM | POA: Insufficient documentation

## 2023-03-27 DIAGNOSIS — Z79899 Other long term (current) drug therapy: Secondary | ICD-10-CM | POA: Insufficient documentation

## 2023-03-27 SURGERY — COLONOSCOPY WITH PROPOFOL
Anesthesia: General

## 2023-03-27 MED ORDER — DEXMEDETOMIDINE HCL IN NACL 80 MCG/20ML IV SOLN
INTRAVENOUS | Status: DC | PRN
  Administered 2023-03-27: 20 ug via INTRAVENOUS

## 2023-03-27 MED ORDER — SODIUM CHLORIDE 0.9 % IV SOLN
INTRAVENOUS | Status: DC
Start: 2023-03-27 — End: 2023-03-27
  Administered 2023-03-27: 1000 mL via INTRAVENOUS

## 2023-03-27 MED ORDER — PROPOFOL 1000 MG/100ML IV EMUL
INTRAVENOUS | Status: AC
  Filled 2023-03-27: qty 100

## 2023-03-27 MED ORDER — LIDOCAINE HCL (CARDIAC) PF 100 MG/5ML IV SOSY
PREFILLED_SYRINGE | INTRAVENOUS | Status: DC | PRN
  Administered 2023-03-27: 50 mg via INTRAVENOUS

## 2023-03-27 MED ORDER — STERILE WATER FOR IRRIGATION IR SOLN
Status: DC | PRN
  Administered 2023-03-27: 60 mL

## 2023-03-27 MED ORDER — PROPOFOL 10 MG/ML IV BOLUS
INTRAVENOUS | Status: DC | PRN
  Administered 2023-03-27: 50 mg via INTRAVENOUS
  Administered 2023-03-27: 10 mg via INTRAVENOUS

## 2023-03-27 MED ORDER — PROPOFOL 500 MG/50ML IV EMUL
INTRAVENOUS | Status: DC | PRN
  Administered 2023-03-27: 75 ug/kg/min via INTRAVENOUS

## 2023-03-27 NOTE — Op Note (Signed)
 [facility] Patient Name: Jade Martin Procedure Date: 03/27/2023 1:04 PM MRN: [id number] Account #: [id number] Date of Birth: 03/03/1950 Admit Type: Outpatient Age: 73 Room: [facility] ENDO ROOM 3 Gender: Female Note Status: Finalized Instrument Name: Nelda Marseille [id number] Procedure:             Colonoscopy Indications:           Surveillance: Personal history of adenomatous polyps                         on last colonoscopy 5 years ago Providers:             Eather Colas MD, MD Medicines:             Monitored Anesthesia Care Complications:         No immediate complications. Procedure:             Pre-Anesthesia Assessment:                        - Prior to the procedure, a History and Physical was                         performed, and patient medications and allergies were                         reviewed. The patient is competent. The risks and                         benefits of the procedure and the sedation options and                         risks were discussed with the patient. All questions                         were answered and informed consent was obtained.                         Patient identification and proposed procedure were                         verified by the physician, the nurse, the                         anesthesiologist, the anesthetist and the technician                         in the endoscopy suite. Mental Status Examination:                         alert and oriented. Airway Examination: normal                         oropharyngeal airway and neck mobility. Respiratory                         Examination: clear to auscultation. CV Examination:                         normal. Prophylactic Antibiotics: The patient does not  require prophylactic antibiotics. Prior                         Anticoagulants: The patient has taken no anticoagulant                         or antiplatelet agents. ASA  Grade Assessment: II - A                         patient with mild systemic disease. After reviewing                         the risks and benefits, the patient was deemed in                         satisfactory condition to undergo the procedure. The                         anesthesia plan was to use monitored anesthesia care                         (MAC). Immediately prior to administration of                         medications, the patient was re-assessed for adequacy                         to receive sedatives. The heart rate, respiratory                         rate, oxygen saturations, blood pressure, adequacy of                         pulmonary ventilation, and response to care were                         monitored throughout the procedure. The physical                         status of the patient was re-assessed after the                         procedure.                        After obtaining informed consent, the colonoscope was                         passed under direct vision. Throughout the procedure,                         the patient's blood pressure, pulse, and oxygen                         saturations were monitored continuously. The                         Colonoscope was introduced through the anus and  advanced to the the cecum, identified by appendiceal                         orifice and ileocecal valve. The colonoscopy was                         performed without difficulty. The patient tolerated                         the procedure well. The quality of the bowel                         preparation was good. The ileocecal valve, appendiceal                         orifice, and rectum were photographed. Findings:      Skin tags were found on perianal exam.      The colon (entire examined portion) appeared normal.      Internal hemorrhoids were found during retroflexion. The hemorrhoids       were Grade I (internal hemorrhoids that do  not prolapse). Impression:            - Perianal skin tags found on perianal exam.                        - The entire examined colon is normal.                        - Internal hemorrhoids.                        - No specimens collected. Recommendation:        - Repeat colonoscopy is not recommended due to current                         age (57 years or older) for surveillance.                        - Return to referring physician as previously                         scheduled. Procedure Code(s):     --- Professional ---                        V7616, Colorectal cancer screening; colonoscopy on                         individual at high risk Diagnosis Code(s):     --- Professional ---                        Z86.010, Personal history of colonic polyps                        K64.0, First degree hemorrhoids                        K64.4, Residual hemorrhoidal skin tags CPT copyright 2022 American Medical Association. All rights reserved. The codes documented in this report are preliminary and upon coder  review may  be revised to meet current compliance requirements. Eather Colas MD, MD 03/27/2023 1:35:22 PM Number of Addenda: 0 Note Initiated On: 03/27/2023 1:04 PM Scope Withdrawal Time: 0 hours 7 minutes 43 seconds  Total Procedure Duration: 0 hours 12 minutes 6 seconds  Estimated Blood Loss:  Estimated blood loss: none.      [facility]

## 2023-03-27 NOTE — Interval H&P Note (Signed)
 History and Physical Interval Note:  03/27/2023 1:10 PM  Jade Martin  has presented today for surgery, with the diagnosis of hx of adenomatous polyp of colon.  The various methods of treatment have been discussed with the patient and family. After consideration of risks, benefits and other options for treatment, the patient has consented to  Procedure(s): COLONOSCOPY WITH PROPOFOL (N/A) as a surgical intervention.  The patient's history has been reviewed, patient examined, no change in status, stable for surgery.  I have reviewed the patient's chart and labs.  Questions were answered to the patient's satisfaction.     Regis Bill  Ok to proceed with colonoscopy

## 2023-03-27 NOTE — Anesthesia Postprocedure Evaluation (Signed)
 Anesthesia Post Note  Patient: Jade Martin  Procedure(s) Performed: COLONOSCOPY WITH PROPOFOL  Patient location during evaluation: PACU Anesthesia Type: General Level of consciousness: awake and awake and alert Pain management: satisfactory to patient Vital Signs Assessment: post-procedure vital signs reviewed and stable Respiratory status: spontaneous breathing Cardiovascular status: blood pressure returned to baseline Anesthetic complications: no   No notable events documented.   Last Vitals:  Vitals:   03/27/23 1228 03/27/23 1333  BP: (!) 159/87 96/82  Pulse: 70 66  Resp: 18 20  Temp: (!) 36.3 C 36.8 C  SpO2: 94% 100%    Last Pain:  Vitals:   03/27/23 1333  TempSrc: Temporal  PainSc: 0-No pain                 VAN STAVEREN,Kniyah Khun

## 2023-03-27 NOTE — Transfer of Care (Signed)
 Immediate Anesthesia Transfer of Care Note  Patient: Jade Martin  Procedure(s) Performed: COLONOSCOPY WITH PROPOFOL  Patient Location: PACU  Anesthesia Type:General  Level of Consciousness: sedated  Airway & Oxygen Therapy: Patient Spontanous Breathing  Post-op Assessment: Report given to RN and Post -op Vital signs reviewed and stable  Post vital signs: Reviewed and stable  Last Vitals:  Vitals Value Taken Time  BP    Temp    Pulse 64 03/27/23 1333  Resp 12 03/27/23 1333  SpO2 98 % 03/27/23 1333  Vitals shown include unfiled device data.  Last Pain:  Vitals:   03/27/23 1228  TempSrc: Temporal  PainSc: 0-No pain         Complications: No notable events documented.

## 2023-03-27 NOTE — H&P (Signed)
 Outpatient short stay form Pre-procedure 03/27/2023  Regis Bill, MD  Primary Physician: Marguarite Arbour, MD  Reason for visit:  Surveillance  History of present illness:    73 y/o lady with history of obesity, hypertension, and hypothyroidism here for surveillance colonoscopy. Last colonoscopy in 2019 with small TA. History of hysterectomy. No blood thinners.    Current Facility-Administered Medications:    0.9 %  sodium chloride infusion, , Intravenous, Continuous, Lemon Sternberg, Rossie Muskrat, MD, Last Rate: 20 mL/hr at 03/27/23 1251, 1,000 mL at 03/27/23 1251  Medications Prior to Admission  Medication Sig Dispense Refill Last Dose/Taking   Cholecalciferol 5000 units TABS Take 1 tablet by mouth daily.   Past Week   cloNIDine (CATAPRES) 0.2 MG tablet Take 0.2 mg by mouth 2 (two) times daily.   03/27/2023 Morning   estradiol (ESTRACE) 0.5 MG tablet Take 0.5 mg by mouth daily.   03/27/2023 Morning   ibuprofen (ADVIL,MOTRIN) 200 MG tablet Take 200 mg by mouth every 6 (six) hours as needed.   Past Month   levothyroxine (SYNTHROID, LEVOTHROID) 125 MCG tablet Take 125 mcg by mouth daily before breakfast.   03/27/2023 Morning   magnesium oxide (MAG-OX) 400 MG tablet Take 400 mg by mouth daily.   Past Week   naproxen (NAPROSYN) 500 MG tablet Take 1 tablet (500 mg total) by mouth 2 (two) times daily with a meal. 20 tablet 0 Past Week   torsemide (DEMADEX) 20 MG tablet Take 20 mg by mouth daily.   03/26/2023 Morning     Allergies  Allergen Reactions   Losartan Potassium-Hctz      Past Medical History:  Diagnosis Date   Arthritis    osteoarthritis   Chronic back pain    Glaucoma    Hypertension    Hypomagnesemia    Hypothyroidism    Obesity     Review of systems:  Otherwise negative.    Physical Exam  Gen: Alert, oriented. Appears stated age.  HEENT: PERRLA. Lungs: No respiratory distress CV: RRR Abd: soft, benign, no masses Ext: No edema    Planned procedures:  Proceed with colonoscopy. The patient understands the nature of the planned procedure, indications, risks, alternatives and potential complications including but not limited to bleeding, infection, perforation, damage to internal organs and possible oversedation/side effects from anesthesia. The patient agrees and gives consent to proceed.  Please refer to procedure notes for findings, recommendations and patient disposition/instructions.     Regis Bill, MD [facility]

## 2023-03-27 NOTE — Anesthesia Preprocedure Evaluation (Signed)
 Anesthesia Evaluation  Patient identified by MRN, date of birth, ID band Patient awake    Reviewed: Allergy & Precautions, NPO status , Patient's Chart, lab work & pertinent test results  Airway Mallampati: III  TM Distance: <3 FB Neck ROM: full    Dental  (+) Teeth Intact   Pulmonary neg pulmonary ROS   Pulmonary exam normal breath sounds clear to auscultation       Cardiovascular Exercise Tolerance: Good hypertension, Pt. on medications negative cardio ROS Normal cardiovascular exam Rhythm:Regular Rate:Normal     Neuro/Psych negative neurological ROS  negative psych ROS   GI/Hepatic negative GI ROS, Neg liver ROS,,,  Endo/Other  negative endocrine ROSHypothyroidism  Class 3 obesity  Renal/GU negative Renal ROS  negative genitourinary   Musculoskeletal   Abdominal  (+) + obese  Peds negative pediatric ROS (+)  Hematology negative hematology ROS (+)   Anesthesia Other Findings Past Medical History: No date: Arthritis     Comment:  osteoarthritis No date: Chronic back pain No date: Glaucoma No date: Hypertension No date: Hypomagnesemia No date: Hypothyroidism No date: Obesity  Past Surgical History: No date: ABDOMINAL HYSTERECTOMY No date: BACK SURGERY 05/01/2017: COLONOSCOPY WITH PROPOFOL; N/A     Comment:  Procedure: COLONOSCOPY WITH PROPOFOL;  Surgeon:               Christena Deem, MD;  Location: [facility] ENDOSCOPY;                Service: Endoscopy;  Laterality: N/A; No date: JOINT REPLACEMENT No date: LUMBAR LAMINECTOMY No date: REPLACEMENT TOTAL KNEE; Bilateral No date: TUBAL LIGATION  BMI    Body Mass Index: 39.48 kg/m      Reproductive/Obstetrics negative OB ROS                             Anesthesia Physical Anesthesia Plan  ASA: 2  Anesthesia Plan: General   Post-op Pain Management:    Induction: Intravenous  PONV Risk Score and Plan: Propofol  infusion and TIVA  Airway Management Planned: Natural Airway and Nasal Cannula  Additional Equipment:   Intra-op Plan:   Post-operative Plan:   Informed Consent: I have reviewed the patients History and Physical, chart, labs and discussed the procedure including the risks, benefits and alternatives for the proposed anesthesia with the patient or authorized representative who has indicated his/her understanding and acceptance.     Dental Advisory Given  Plan Discussed with: CRNA  Anesthesia Plan Comments:        Anesthesia Quick Evaluation

## 2023-05-15 ENCOUNTER — Other Ambulatory Visit: Payer: Self-pay | Admitting: Internal Medicine

## 2023-05-15 DIAGNOSIS — Z1231 Encounter for screening mammogram for malignant neoplasm of breast: Secondary | ICD-10-CM
